# Patient Record
Sex: Male | Born: 1995 | Race: Black or African American | Hispanic: No | Marital: Single | Smoking: Never smoker
Health system: Southern US, Community
[De-identification: ages and names within clinical notes are randomized; demographics above are authoritative.]

## PROBLEM LIST (undated history)

## (undated) HISTORY — PX: MOUTH SURGERY: SHX715

---
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
## 2017-03-21 IMAGING — CR DG KNEE COMPLETE 4+V*L*
4 series · 4 of 4 positions shown · non-contrast
Comparison: None.

CLINICAL DATA: Acute onset of left knee injury while playing
basketball. Initial encounter.

EXAM:
LEFT KNEE - COMPLETE 4+ VIEW

[knee ap]
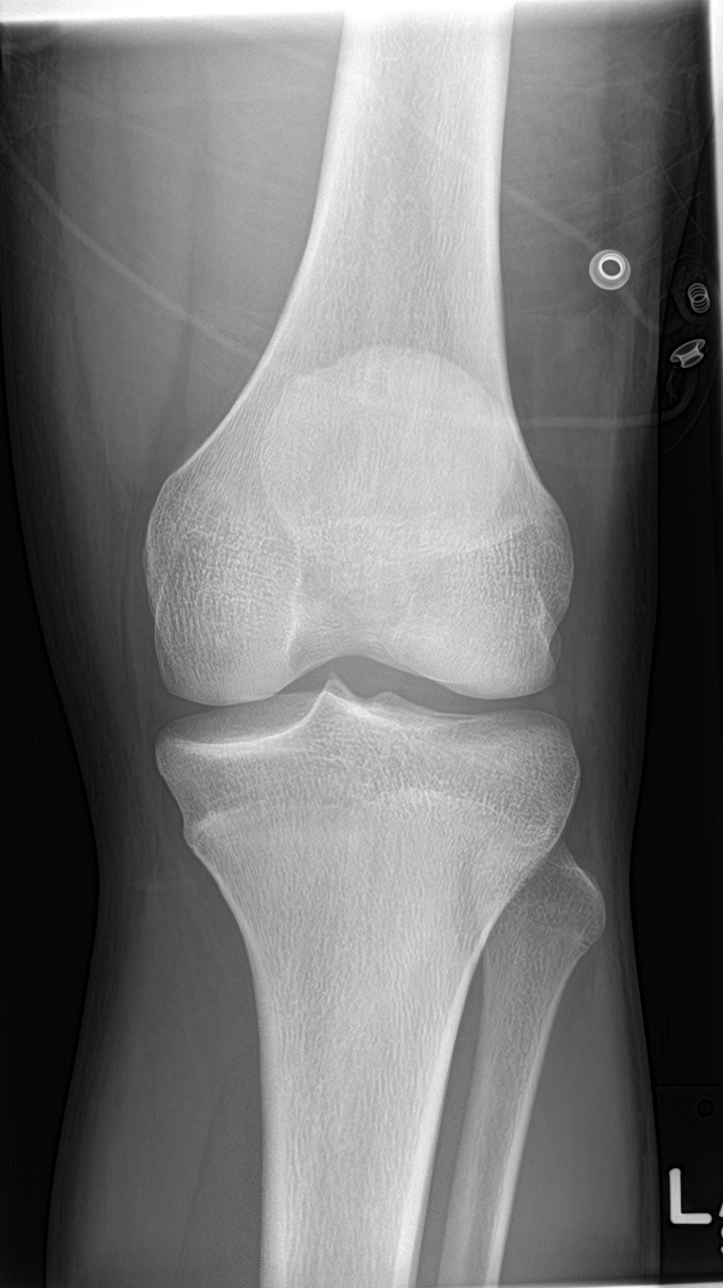

[knee lat]
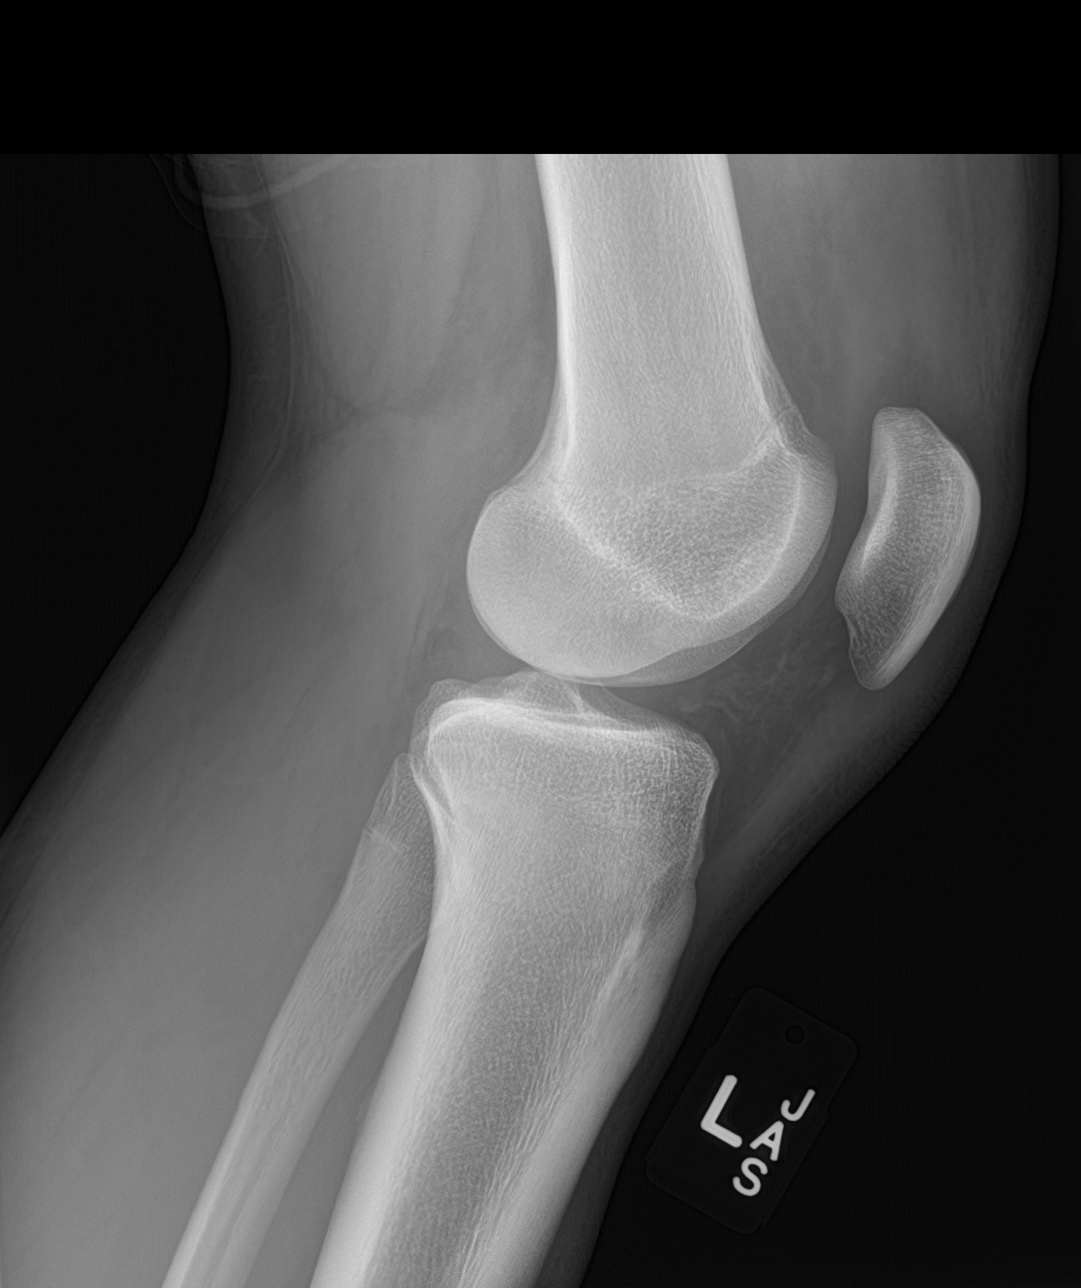

[knee obl (1 of 2)]
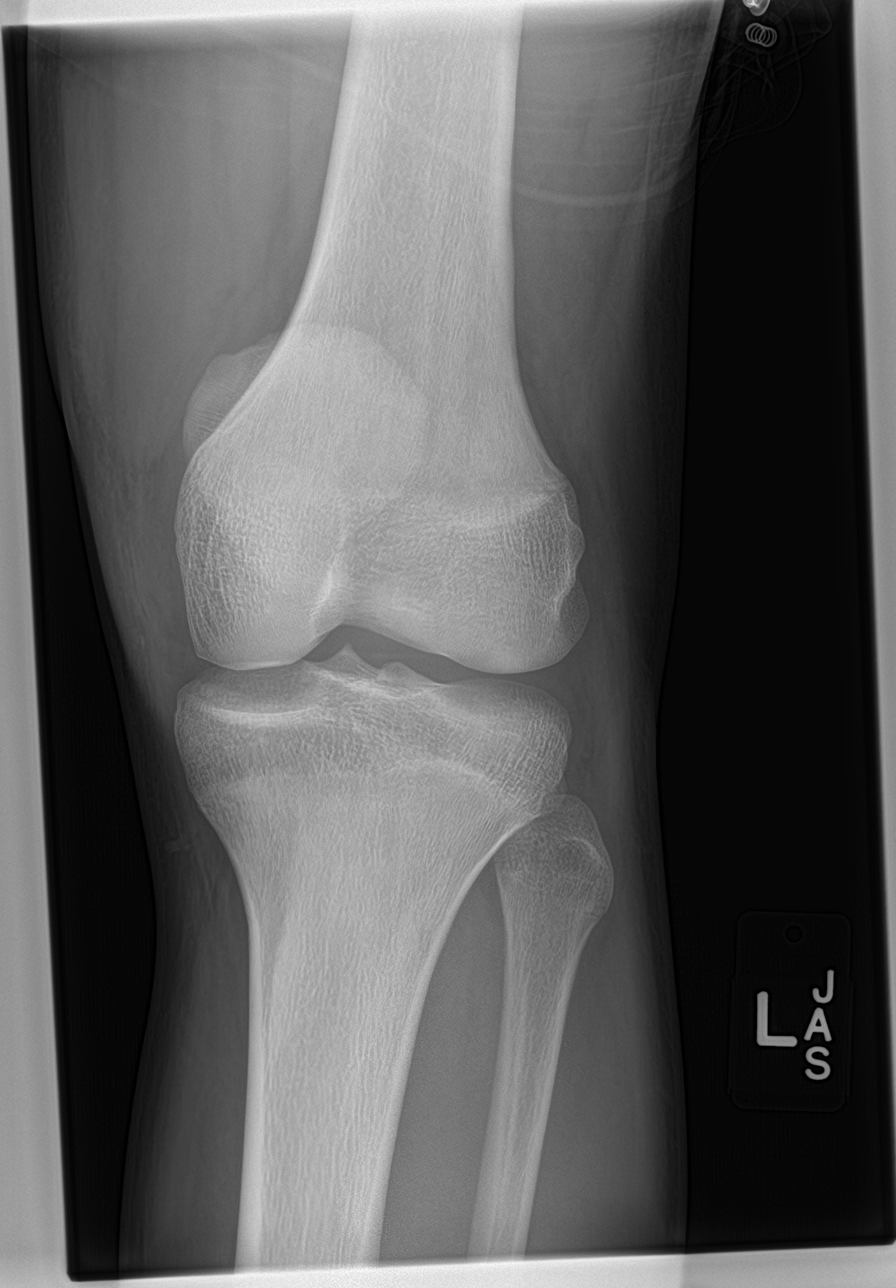

[knee obl (2 of 2)]
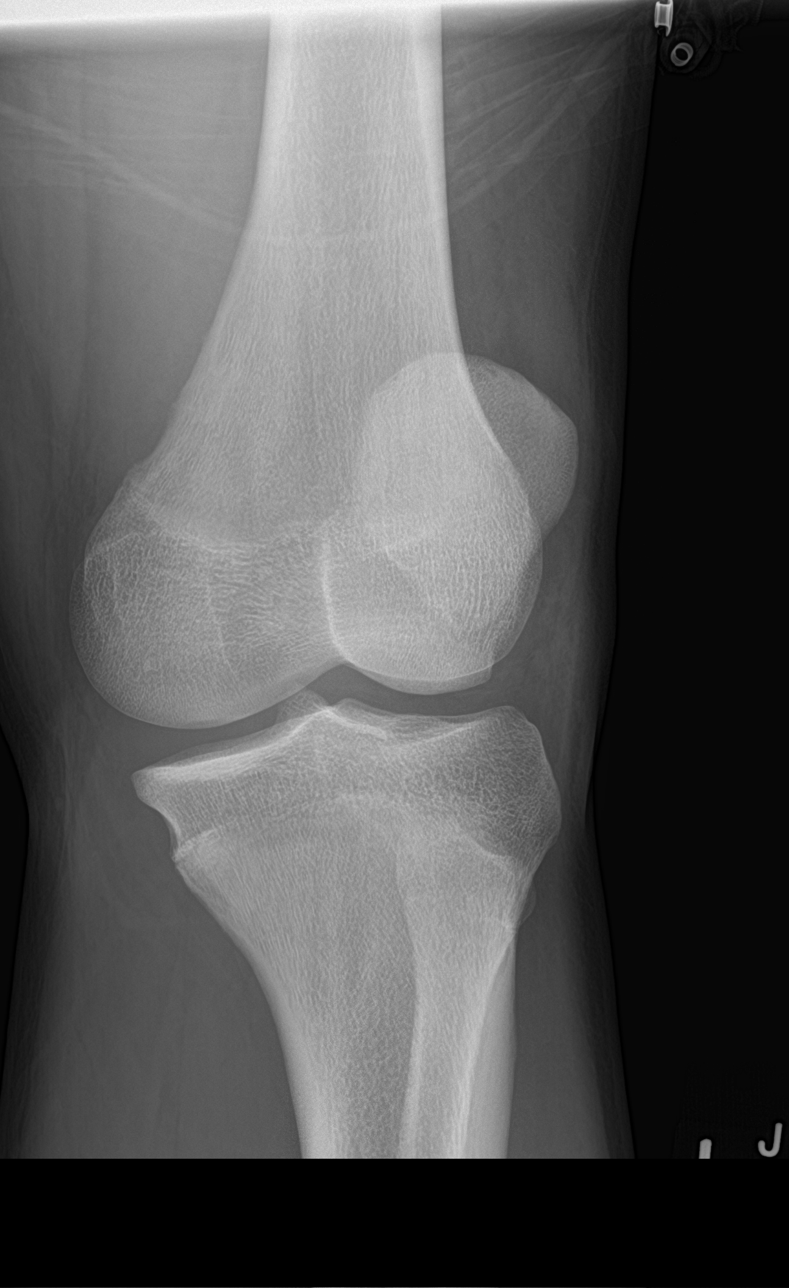

[4 of 4 positions shown; findings below may reference images not displayed]

FINDINGS: There is no evidence of fracture or dislocation. Visualized physes
are within normal limits. The joint spaces are preserved. No
significant degenerative change is seen; the patellofemoral joint is
grossly unremarkable in appearance.

A small knee joint effusion is noted. The visualized soft tissues
are normal in appearance.
IMPRESSION: 1. No evidence of fracture or dislocation.
2. Small knee joint effusion noted.

## 2022-12-04 DIAGNOSIS — Z0000 Encounter for general adult medical examination without abnormal findings: Secondary | ICD-10-CM | POA: Diagnosis not present

## 2022-12-04 DIAGNOSIS — D72818 Other decreased white blood cell count: Secondary | ICD-10-CM | POA: Diagnosis not present

## 2022-12-04 DIAGNOSIS — Z13220 Encounter for screening for lipoid disorders: Secondary | ICD-10-CM | POA: Diagnosis not present
